# Patient Record
Sex: Male | Born: 1990 | Race: White | Hispanic: No | Marital: Married | Smoking: Former smoker
Health system: Southern US, Community
[De-identification: ages and names within clinical notes are randomized; demographics above are authoritative.]

## PROBLEM LIST (undated history)

## (undated) DIAGNOSIS — F419 Anxiety disorder, unspecified: Secondary | ICD-10-CM

## (undated) HISTORY — PX: APPENDECTOMY: SHX54

## (undated) HISTORY — DX: Anxiety disorder, unspecified: F41.9

---

## 2018-06-06 ENCOUNTER — Emergency Department (HOSPITAL_COMMUNITY): Payer: 59

## 2018-06-06 ENCOUNTER — Encounter (HOSPITAL_COMMUNITY): Payer: Self-pay | Admitting: Emergency Medicine

## 2018-06-06 ENCOUNTER — Emergency Department (HOSPITAL_COMMUNITY)
Admission: EM | Admit: 2018-06-06 | Discharge: 2018-06-06 | Disposition: A | Discharge status: Home / Self Care | Payer: 59 | Attending: Emergency Medicine | Admitting: Emergency Medicine

## 2018-06-06 DIAGNOSIS — R0602 Shortness of breath: Secondary | ICD-10-CM | POA: Diagnosis not present

## 2018-06-06 DIAGNOSIS — Z87891 Personal history of nicotine dependence: Secondary | ICD-10-CM | POA: Diagnosis not present

## 2018-06-06 LAB — BASIC METABOLIC PANEL
ANION GAP: 10 (ref 5–15)
BUN: 17 mg/dL (ref 6–20)
CALCIUM: 9.2 mg/dL (ref 8.9–10.3)
CO2: 27 mmol/L (ref 22–32)
Chloride: 104 mmol/L (ref 98–111)
Creatinine, Ser: 0.82 mg/dL (ref 0.61–1.24)
GLUCOSE: 116 mg/dL — AB (ref 70–99)
POTASSIUM: 3.9 mmol/L (ref 3.5–5.1)
SODIUM: 141 mmol/L (ref 135–145)

## 2018-06-06 LAB — CBC
HEMATOCRIT: 44.2 % (ref 39.0–52.0)
HEMOGLOBIN: 16.2 g/dL (ref 13.0–17.0)
MCH: 31.8 pg (ref 26.0–34.0)
MCHC: 36.7 g/dL — ABNORMAL HIGH (ref 30.0–36.0)
MCV: 86.8 fL (ref 78.0–100.0)
Platelets: 168 10*3/uL (ref 150–400)
RBC: 5.09 MIL/uL (ref 4.22–5.81)
RDW: 12.1 % (ref 11.5–15.5)
WBC: 4 10*3/uL (ref 4.0–10.5)

## 2018-06-06 LAB — I-STAT TROPONIN, ED: TROPONIN I, POC: 0.02 ng/mL (ref 0.00–0.08)

## 2018-06-06 LAB — D-DIMER, QUANTITATIVE (NOT AT ARMC)

## 2018-06-06 MED ORDER — ALBUTEROL SULFATE (2.5 MG/3ML) 0.083% IN NEBU
5.0000 mg | INHALATION_SOLUTION | Freq: Once | RESPIRATORY_TRACT | Status: AC
  Administered 2018-06-06: 5 mg via RESPIRATORY_TRACT
  Filled 2018-06-06: qty 6

## 2018-06-06 NOTE — ED Triage Notes (Signed)
Pt c/o central chest pain and SOB since Monday intermittently. Reports stopped smoking cigarettes and started smoking vape/jewel.

## 2018-06-06 NOTE — ED Provider Notes (Signed)
[facility]-EMERGENCY DEPT Provider Note   CSN: [id number] Arrival date & time: 06/06/18  [id number]     History   Chief Complaint Chief Complaint  Patient presents with  . Chest Pain  . Shortness of Breath    HPI George Villarreal is a 27 y.o. male history of appendectomy who presenting with shortness of breath.  States that for the last 3 to 4 days, he has some subjective shortness of breath.  He states that shortness of breath happens when he is at rest.  He recently read the news about vaping and was concerned.  Patient used to smoke cigarettes but started vaping and last time he used it was yesterday.  Patient denies any fevers or chills or cough.  He did travel to [place] a week ago and had a long car ride.  Denies any leg swelling or history of blood clots.  Denies any previous cardiac history.  The history is provided by the patient.    History reviewed. No pertinent past medical history.  There are no active problems to display for this patient.   Past Surgical History:  Procedure Laterality Date  . APPENDECTOMY          Home Medications    Prior to Admission medications   Not on File    Family History No family history on file.  Social History Social History   Tobacco Use  . Smoking status: Former Games developermoker  . Smokeless tobacco: Never Used  Substance Use Topics  . Alcohol use: Not Currently  . Drug use: Not on file     Allergies   Patient has no known allergies.   Review of Systems Review of Systems  Respiratory: Positive for shortness of breath.   Cardiovascular: Positive for chest pain.  All other systems reviewed and are negative.    Physical Exam Updated Vital Signs BP (!) 142/91 (BP Location: Right Arm)   Pulse 67   Temp 98.1 F (36.7 C) (Oral)   Resp 18   Ht 6' (1.829 m)   Wt 103 kg   SpO2 98%   BMI 30.79 kg/m   Physical Exam  Constitutional: He is oriented to person, place, and time.  Slightly anxious     HENT:  Head: Normocephalic.  Eyes: Pupils are equal, round, and reactive to light. EOM are normal.  Neck: Normal range of motion. Neck supple.  Cardiovascular: Normal rate, regular rhythm and normal pulses.  Pulmonary/Chest:  Diminished throughout. No obvious wheezing or crackles   Abdominal: Soft. Bowel sounds are normal.  Musculoskeletal: Normal range of motion.       Right lower leg: Normal.       Left lower leg: Normal.  Neurological: He is alert and oriented to person, place, and time.  Skin: Skin is warm. Capillary refill takes less than 2 seconds.  Psychiatric: He has a normal mood and affect. His behavior is normal.  Nursing note and vitals reviewed.    ED Treatments / Results  Labs (all labs ordered are listed, but only abnormal results are displayed) Labs Reviewed  CBC - Abnormal; Notable for the following components:      Result Value   MCHC 36.7 (*)    All other components within normal limits  BASIC METABOLIC PANEL  D-DIMER, QUANTITATIVE (NOT AT [facility])  I-STAT TROPONIN, ED    EKG EKG Interpretation  Date/Time:  Thursday June 06 2018 08:50:10 EDT Ventricular Rate:  75 PR Interval:    QRS Duration: 115  QT Interval:  382 QTC Calculation: 427 R Axis:   -68 Text Interpretation:  Sinus rhythm LAD, consider left anterior fascicular block No previous ECGs available Confirmed by Richardean Canal [phone]) on 06/06/2018 9:20:36 AM   Radiology No results found.  Procedures Procedures (including critical care time)  Medications Ordered in ED Medications  albuterol (PROVENTIL) (2.5 MG/3ML) 0.083% nebulizer solution 5 mg (has no administration in time range)     Initial Impression / Assessment and Plan / ED Course  I have reviewed the triage vital signs and the nursing notes.  Pertinent labs & imaging results that were available during my care of the patient were reviewed by me and considered in my medical decision making (see chart for details).    George Villarreal is a 27 y.o. male here with SOB. I think likely mild bronchitis. He does vape so consider pneumothorax as well so will get CXR. Had recent travel to [place] so will get d-dimer. Will give nebs and reassess.   11:09 AM CXR clear. No pneumothorax or pneumomediastinum. D dimer neg. Labs unremarkable. Given nebs but felt the same. I think likely side effect of vaping. Told him to stop vaping and follow up with PCP outpatient and return if symptoms got worse    Final Clinical Impressions(s) / ED Diagnoses   Final diagnoses:  None    ED Discharge Orders    None       Charlynne Pander, MD 06/06/18 1109

## 2018-06-06 NOTE — Discharge Instructions (Signed)
Take tylenol or motrin if you have pain   Stop smoking and vaping   Follow up with your doctor   Return to ER if you have worse shortness of breath, trouble breathing, fever.

## 2019-04-25 ENCOUNTER — Telehealth: Payer: Self-pay

## 2019-04-25 NOTE — Telephone Encounter (Signed)
NOTES ON FILE FROM [place] [phone] SENT REFERRAL TO SCHEDULING-

## 2019-06-23 ENCOUNTER — Telehealth: Payer: Self-pay | Admitting: Internal Medicine

## 2019-06-23 NOTE — Telephone Encounter (Signed)
New message:     Patient needs a appt with Dr. Rayann Heman there is a referral in [place] I am not sure if we doing NP please let me know when he is urgent.

## 2019-06-24 NOTE — Progress Notes (Signed)
Virtual Visit via Video Note   This visit type was conducted due to national recommendations for restrictions regarding the COVID-19 Pandemic (e.g. social distancing) in an effort to limit this patient's exposure and mitigate transmission in our community.  Due to his co-morbid illnesses, this patient is at least at moderate risk for complications without adequate follow up.  This format is felt to be most appropriate for this patient at this time.  All issues noted in this document were discussed and addressed.  A limited physical exam was performed with this format.  Please refer to the patient's chart for his consent to telehealth for [facility].   Evaluation Performed:  Cardiology Consult  This visit type was conducted due to national recommendations for restrictions regarding the COVID-19 Pandemic (e.g. social distancing).  This format is felt to be most appropriate for this patient at this time.  All issues noted in this document were discussed and addressed.  No physical exam was performed (except for noted visual exam findings with Video Visits).  Please refer to the patient's chart (MyChart message for video visits and phone note for telephone visits) for the patient's consent to telehealth for [facility].  Date:  06/25/2019   ID:  George Vill[facility]real, DOB [birth year], MRN [id number]  Patient Location:  Home  Provider location:   [place]  PCP: Joyce Gross, DO with [facility]  Cardiologist:  NEW Electrophysiologist:  None   Chief Complaint:  Chest pain and SOB  History of Present Illness:    George Villarreal is a 28 y.o. male who presents via audio/video conferencing for a telehealth visit today for evaluation of chest pain, SOB and IRBB by Joyce Gross, DO.  This is a 28yo male with no prior cardiac hx but a hx of anxiety.  He tells me that he started having CP about a year ago that was a constant pressure that was hard to get a breath with.  It was worse  when he would lay down to go to sleep and would feel like he could not breath.  It subsided over the winter and then in June came back and lasted a month and then went away.  There are no associated sx of N/V or diaphoresis.  There is no radiation of the discomfort.  It is localized mid sternal and will be constant never going away but gets worse when he lays down on his back or chest. He has no SOB.  He denies any LE edema, dizziness, palpitations or syncope.  He used to smoke but quit 6 months ago.  He has a maternal grandmother who had CAD in her late 49's and died during a PCI.  His mom has carotid disease.   The patient does not have symptoms concerning for COVID-19 infection (fever, chills, cough, or new shortness of breath).    Prior CV studies:   The following studies were reviewed today:  none  Past Medical History:  Diagnosis Date   Anxiety    Past Surgical History:  Procedure Laterality Date   APPENDECTOMY       No outpatient medications have been marked as taking for the 06/25/19 encounter (Telemedicine) with Sueanne Margarita, MD.     Allergies:   Patient has no known allergies.   Social History   Tobacco Use   Smoking status: Former Smoker   Smokeless tobacco: Never Used  Substance Use Topics   Alcohol use: Not Currently   Drug use: Not on file  Family Hx: The patient's family history is not on file.  ROS:   Please see the history of present illness.     All other systems reviewed and are negative.   Labs/Other Tests and Data Reviewed:    Recent Labs: No results found for requested labs within last 8760 hours.   Recent Lipid Panel No results found for: CHOL, TRIG, HDL, CHOLHDL, LDLCALC, LDLDIRECT  Wt Readings from Last 3 Encounters:  06/06/18 227 lb (103 kg)     Objective:    Vital Signs:  There were no vitals taken for this visit.   CONSTITUTIONAL:  Well nourished, well developed male in no acute distress.  EYES: anicteric MOUTH: oral  mucosa is pink RESPIRATORY: Normal respiratory effort, symmetric expansion CARDIOVASCULAR: No peripheral edema SKIN: No rash, lesions or ulcers MUSCULOSKELETAL: no digital cyanosis NEURO: Cranial Nerves II-XII grossly intact, moves all extremities PSYCH: Intact judgement and insight.  A&O x 3, Mood/affect appropriate   ASSESSMENT & PLAN:    1.  Chest pain -his pain is very atypical and is constant for a few days at a time with no associated sx -the pain is a pressure and he dose have vascular disease on his mom's side of the family and he has a hx of tobacco use -recommend Cardiac MRI to assess for anomalous coronary artery as well as assess for structural heart disease -will get an ETT   COVID-19 Education: The signs and symptoms of COVID-19 were discussed with the patient and Villarreal to seek care for testing (follow up with PCP or arrange E-visit).  The importance of social distancing was discussed today.  Patient Risk:   After full review of this patient's clinical status, I feel that they are at least moderate risk at this time.  Time:   Today, I have spent 20 minutes directly with the patient on telemedicine discussing medical problems including CP and SOB.  We also reviewed the symptoms of COVID 19 and the ways to protect against contracting the virus with telehealth technology.  I spent an additional 5 minutes reviewing patient's chart including office notes from PCP.  Medication Adjustments/Labs and Tests Ordered: Current medicines are reviewed at length with the patient today.  Concerns regarding medicines are outlined above.  Tests Ordered: No orders of the defined types were placed in this encounter.  Medication Changes: No orders of the defined types were placed in this encounter.   Disposition:  Follow up prn  Signed, Armanda Magic, MD  06/25/2019 10:25 AM    [facility]

## 2019-06-25 ENCOUNTER — Telehealth (INDEPENDENT_AMBULATORY_CARE_PROVIDER_SITE_OTHER): Payer: 59 | Admitting: Cardiology

## 2019-06-25 ENCOUNTER — Encounter: Payer: Self-pay | Admitting: Cardiology

## 2019-06-25 ENCOUNTER — Other Ambulatory Visit: Payer: Self-pay

## 2019-06-25 ENCOUNTER — Telehealth: Payer: Self-pay | Admitting: *Deleted

## 2019-06-25 ENCOUNTER — Encounter: Payer: Self-pay | Admitting: *Deleted

## 2019-06-25 DIAGNOSIS — R079 Chest pain, unspecified: Secondary | ICD-10-CM | POA: Diagnosis not present

## 2019-06-25 DIAGNOSIS — R0789 Other chest pain: Secondary | ICD-10-CM | POA: Diagnosis not present

## 2019-06-25 NOTE — Telephone Encounter (Signed)
YOUR CARDIOLOGY TEAM HAS ARRANGED FOR AN E-VISIT FOR YOUR APPOINTMENT - PLEASE REVIEW IMPORTANT INFORMATION BELOW SEVERAL DAYS PRIOR TO YOUR APPOINTMENT  Due to the recent COVID-19 pandemic, we are transitioning in-person office visits to tele-medicine visits in an effort to decrease unnecessary exposure to our patients, their families, and staff. These visits are billed to your insurance just like a normal visit is. We also encourage you to sign up for MyChart if you have not already done so. You will need a smartphone if possible. For patients that do not have this, we can still complete the visit using a regular telephone but do prefer a smartphone to enable video when possible. You may have a family member that lives with you that can help. If possible, we also ask that you have a blood pressure cuff and scale at home to measure your blood pressure, heart rate and weight prior to your scheduled appointment. Patients with clinical needs that need an in-person evaluation and testing will still be able to come to the office if absolutely necessary. If you have any questions, feel free to call our office.     YOUR PROVIDER WILL BE USING THE FOLLOWING PLATFORM TO COMPLETE YOUR VISIT: VIDEO CALL ON MOBILE NUMBER  . IF USING DOXIMITY or [email or web address] - The staff will give you instructions on receiving your link to join the meeting the day of your visit.       THE DAY OF YOUR APPOINTMENT  Approximately 15 minutes prior to your scheduled appointment, you will receive a telephone call from one of [facility] team - your caller ID may say "Unknown caller."  Our staff will confirm medications, vital signs for the day and any symptoms you may be experiencing. Please have this information available prior to the time of visit start. It may also be helpful for you to have a pad of paper and pen handy for any instructions given during your visit. They will also walk you through joining the smartphone meeting if this is  a video visit.    CONSENT FOR TELE-HEALTH VISIT - PLEASE REVIEW  I hereby voluntarily request, consent and authorize [facility] and its employed or contracted physicians, physician assistants, nurse practitioners or other licensed health care professionals (the Practitioner), to provide me with telemedicine health care services (the "Services") as deemed necessary by the treating Practitioner. I acknowledge and consent to receive the Services by the Practitioner via telemedicine. I understand that the telemedicine visit will involve communicating with the Practitioner through live audiovisual communication technology and the disclosure of certain medical information by electronic transmission. I acknowledge that I have been given the opportunity to request an in-person assessment or other available alternative prior to the telemedicine visit and am voluntarily participating in the telemedicine visit.  I understand that I have the right to withhold or withdraw my consent to the use of telemedicine in the course of my care at any time, without affecting my right to future care or treatment, and that the Practitioner or I may terminate the telemedicine visit at any time. I understand that I have the right to inspect all information obtained and/or recorded in the course of the telemedicine visit and may receive copies of available information for a reasonable fee.  I understand that some of the potential risks of receiving the Services via telemedicine include:  Marland Kitchen Delay or interruption in medical evaluation due to technological equipment failure or disruption; . Information transmitted may not be sufficient (e.g. poor resolution  of images) to allow for appropriate medical decision making by the Practitioner; and/or  . In rare instances, security protocols could fail, causing a breach of personal health information.  Furthermore, I acknowledge that it is my responsibility to provide information about my  medical history, conditions and care that is complete and accurate to the best of my ability. I acknowledge that Practitioner's advice, recommendations, and/or decision may be based on factors not within their control, such as incomplete or inaccurate data provided by me or distortions of diagnostic images or specimens that may result from electronic transmissions. I understand that the practice of medicine is not an exact science and that Practitioner makes no warranties or guarantees regarding treatment outcomes. I acknowledge that I will receive a copy of this consent concurrently upon execution via email to the email address I last provided but may also request a printed copy by calling the office of [facility].    I understand that my insurance will be billed for this visit.   I have read or had this consent read to me. . I understand the contents of this consent, which adequately explains the benefits and risks of the Services being provided via telemedicine.  . I have been provided ample opportunity to ask questions regarding this consent and the Services and have had my questions answered to my satisfaction. . I give my informed consent for the services to be provided through the use of telemedicine in my medical care  By participating in this telemedicine visit I agree to the above.

## 2019-06-25 NOTE — Patient Instructions (Addendum)
Your physician recommends that you continue on your current medications as directed. Please refer to the Current Medication list given to you today.   Your physician has requested that you have an exercise tolerance test. For further information please visit [email or web address].[email or web address]. Please also follow instruction sheet, as given.   Your physician has requested that you have a cardiac MRI. Cardiac MRI uses a computer to create images of your heart as its beating, producing both still and moving pictures of your heart and major blood vessels. For further information please visit [email or web address] Please follow the instruction sheet given to you today for more information. AND MRA  Your physician recommends that you schedule a follow-up appointment in:  AS NEEDED

## 2019-07-03 ENCOUNTER — Encounter: Payer: Self-pay | Admitting: Cardiology

## 2019-07-07 ENCOUNTER — Telehealth: Payer: Self-pay | Admitting: Cardiology

## 2019-07-07 NOTE — Telephone Encounter (Signed)
Faxing to expedited appeals

## 2019-07-07 NOTE — Telephone Encounter (Signed)
-----   Message from Sueanne Margarita, MD sent at 07/06/2019  7:55 PM EDT ----- Regarding: RE: cardiac mri Cardiac MRI is to rule out anomalous coronary artery which 2D echo cannot assess - this should be covered by insurance if anomalous coronary artery dx used  Fransico Him ----- Message ----- From: Benancio Deeds Sent: 07/04/2019   8:05 AM EDT To: Sueanne Margarita, MD Subject: cardiac mri                                    [employer] has approved MRA chest, however is denying cardiac mri. Their policy states they have to have a recent echo prior to considering approval of a cardiac mri.  We could set up a peer review, schedule patient for an echo, or I could certainly try and appeal this for you.  Please advise on what you would prefer.  Thank you!    Caryl Pina

## 2019-07-09 ENCOUNTER — Telehealth: Payer: Self-pay | Admitting: *Deleted

## 2019-07-09 NOTE — Telephone Encounter (Signed)
Spoke with patient regarding appointment for Cardiac MRI and MRA Chest scheduled for 07/21/19 at 2:00 pm at [facility].  Patient states he is aware of this appointment.

## 2019-07-21 ENCOUNTER — Ambulatory Visit (HOSPITAL_COMMUNITY): Payer: 59

## 2019-07-29 ENCOUNTER — Other Ambulatory Visit: Payer: Self-pay | Admitting: *Deleted

## 2019-07-29 DIAGNOSIS — R079 Chest pain, unspecified: Secondary | ICD-10-CM

## 2019-08-01 ENCOUNTER — Telehealth (HOSPITAL_COMMUNITY): Payer: Self-pay | Admitting: Emergency Medicine

## 2019-08-01 NOTE — Telephone Encounter (Signed)
Reaching out to patient to offer assistance regarding upcoming cardiac imaging study; pt verbalizes understanding of appt date/time, parking situation and where to check in, and verified current allergies; name and call back number provided for further questions should they arise Tyyonna Soucy RN Navigator Cardiac Imaging [facility] [phone] office [phone] cell 

## 2019-08-04 ENCOUNTER — Ambulatory Visit (HOSPITAL_COMMUNITY): Payer: 59

## 2019-08-26 ENCOUNTER — Telehealth (HOSPITAL_COMMUNITY): Payer: Self-pay | Admitting: Emergency Medicine

## 2019-08-26 NOTE — Telephone Encounter (Signed)
Reaching out to patient to offer assistance regarding upcoming cardiac imaging study; pt verbalizes understanding of appt date/time, parking situation and where to check in, pre-test NPO status and medications ordered, and verified current allergies; name and call back number provided for further questions should they arise Imran Nuon RN Navigator Cardiac Imaging [facility] [phone] office [phone] cell 

## 2019-08-27 ENCOUNTER — Ambulatory Visit (HOSPITAL_COMMUNITY): Admission: RE | Admit: 2019-08-27 | Payer: 59 | Source: Ambulatory Visit

## 2019-08-28 ENCOUNTER — Telehealth (HOSPITAL_COMMUNITY): Payer: Self-pay

## 2019-08-28 NOTE — Telephone Encounter (Signed)
Encounter complete. 

## 2019-08-29 ENCOUNTER — Other Ambulatory Visit (HOSPITAL_COMMUNITY): Payer: 59

## 2019-09-02 ENCOUNTER — Inpatient Hospital Stay (HOSPITAL_COMMUNITY): Admission: RE | Admit: 2019-09-02 | Payer: 59 | Source: Ambulatory Visit

## 2019-09-06 ENCOUNTER — Other Ambulatory Visit (HOSPITAL_COMMUNITY)
Admission: RE | Admit: 2019-09-06 | Discharge: 2019-09-06 | Disposition: A | Discharge status: Home / Self Care | Payer: 59 | Source: Ambulatory Visit | Attending: Cardiology | Admitting: Cardiology

## 2019-09-06 DIAGNOSIS — Z01812 Encounter for preprocedural laboratory examination: Secondary | ICD-10-CM | POA: Diagnosis present

## 2019-09-06 DIAGNOSIS — Z20828 Contact with and (suspected) exposure to other viral communicable diseases: Secondary | ICD-10-CM | POA: Diagnosis not present

## 2019-09-07 LAB — NOVEL CORONAVIRUS, NAA (HOSP ORDER, SEND-OUT TO REF LAB; TAT 18-24 HRS): SARS-CoV-2, NAA: NOT DETECTED

## 2019-09-10 ENCOUNTER — Other Ambulatory Visit: Payer: Self-pay

## 2019-09-10 ENCOUNTER — Ambulatory Visit (HOSPITAL_COMMUNITY)
Admission: RE | Admit: 2019-09-10 | Discharge: 2019-09-10 | Disposition: A | Discharge status: Home / Self Care | Payer: 59 | Source: Ambulatory Visit | Attending: Cardiology | Admitting: Cardiology

## 2019-09-10 DIAGNOSIS — R079 Chest pain, unspecified: Secondary | ICD-10-CM | POA: Diagnosis not present

## 2019-09-10 LAB — EXERCISE TOLERANCE TEST
Estimated workload: 12.6 METS
Exercise duration (min): 10 min
Exercise duration (sec): 34 s
MPHR: 190 {beats}/min
Peak HR: 173 {beats}/min
Percent HR: 91 %
RPE: 16
Rest HR: 75 {beats}/min

## 2019-09-11 ENCOUNTER — Telehealth: Payer: Self-pay | Admitting: *Deleted

## 2019-09-11 ENCOUNTER — Encounter: Payer: Self-pay | Admitting: *Deleted

## 2019-09-11 NOTE — Telephone Encounter (Signed)
Spoke with patient regarding cardiac mri appointment scheduled 09/15/19 at 12:00pm at [facility]---arrival time 11:15 am 1st floor admissions office. Will mail information to patient  And it is also available in My Chart

## 2019-09-12 ENCOUNTER — Telehealth (HOSPITAL_COMMUNITY): Payer: Self-pay | Admitting: Emergency Medicine

## 2019-09-12 ENCOUNTER — Telehealth: Payer: Self-pay | Admitting: Radiology

## 2019-09-12 ENCOUNTER — Encounter (HOSPITAL_COMMUNITY): Payer: Self-pay

## 2019-09-12 DIAGNOSIS — I493 Ventricular premature depolarization: Secondary | ICD-10-CM

## 2019-09-12 NOTE — Telephone Encounter (Signed)
Left message on voicemail with name and callback number Orval Dortch RN Navigator Cardiac Imaging [facility] [phone] Office [phone] Cell  

## 2019-09-12 NOTE — Telephone Encounter (Signed)
Enrolled patient for a 4 day Zio monitor to be mailed to patients home.  

## 2019-09-15 ENCOUNTER — Ambulatory Visit (HOSPITAL_COMMUNITY)
Admission: RE | Admit: 2019-09-15 | Discharge: 2019-09-15 | Disposition: A | Discharge status: Home / Self Care | Payer: 59 | Source: Ambulatory Visit | Attending: Cardiology | Admitting: Cardiology

## 2019-09-15 ENCOUNTER — Other Ambulatory Visit: Payer: Self-pay

## 2019-09-15 DIAGNOSIS — R079 Chest pain, unspecified: Secondary | ICD-10-CM | POA: Insufficient documentation

## 2019-09-15 MED ORDER — GADOBUTROL 1 MMOL/ML IV SOLN
14.0000 mL | Freq: Once | INTRAVENOUS | Status: AC | PRN
  Administered 2019-09-15: 14:00:00 14 mL via INTRAVENOUS

## 2019-09-27 ENCOUNTER — Other Ambulatory Visit (INDEPENDENT_AMBULATORY_CARE_PROVIDER_SITE_OTHER): Payer: 59

## 2019-09-27 DIAGNOSIS — I493 Ventricular premature depolarization: Secondary | ICD-10-CM

## 2019-10-06 ENCOUNTER — Other Ambulatory Visit (HOSPITAL_COMMUNITY): Payer: 59

## 2020-05-30 IMAGING — MR MR CARD MORPHOLOGY WO/W CM
46 of 48 series · 46 of 48 positions shown · IV contrast (gadavist)
Comparison: none

CLINICAL DATA: 28M with frequent PVCs on ETT. Evaluate for coronary
artery anomalies

EXAM:
CARDIAC MRI
TECHNIQUE: The patient was scanned on a 1.5 Tesla Siemens magnet. A dedicated
cardiac coil was used. Functional imaging was done using [employer]
sequences. [DATE], and 4 chamber views were done to assess for RWMA's.
Modified [place] rule using a short axis stack was used to
calculate an ejection fraction on a dedicated work station using
[employer] software. The patient received 14 cc of Gadavist. After 10
minutes inversion recovery sequences were used to assess for
infiltration and scar tissue.
CONTRAST:  14 cc  of Gadavist

[Series 6: bSSFP · oblique · 8.0mm · 1.61mm/px · 1 of 25 slices shown (1 of 17)]
[im 1/25]
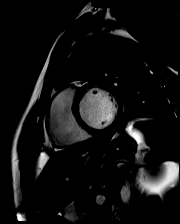

[Series 6: bSSFP · oblique · 8.0mm · 1.61mm/px · 1 of 25 slices shown (2 of 17)]
[im 1/25]
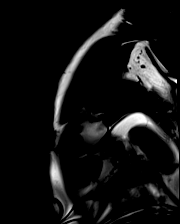

[Series 6: bSSFP · oblique · 8.0mm · 1.61mm/px · 1 of 25 slices shown (3 of 17)]
[im 1/25]
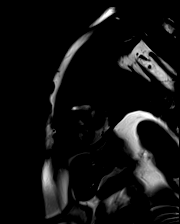

[Series 6: bSSFP · oblique · 8.0mm · 1.61mm/px · 1 of 25 slices shown (4 of 17)]
[im 1/25]
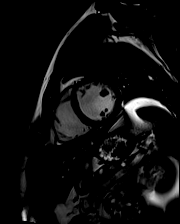

[Series 6: bSSFP · oblique · 8.0mm · 1.61mm/px · 1 of 25 slices shown (5 of 17)]
[im 1/25]
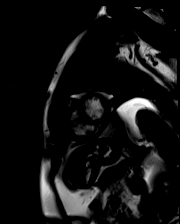

[Series 6: bSSFP · oblique · 8.0mm · 1.61mm/px · 1 of 25 slices shown (6 of 17)]
[im 1/25]
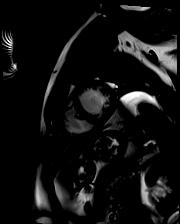

[Series 6: bSSFP · oblique · 8.0mm · 1.61mm/px · 1 of 25 slices shown (7 of 17)]
[im 1/25]
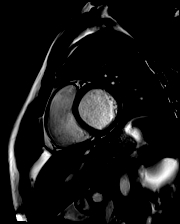

[Series 6: bSSFP · oblique · 8.0mm · 1.61mm/px · 1 of 25 slices shown (8 of 17)]
[im 1/25]
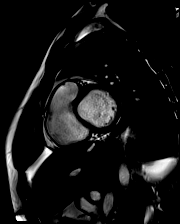

[Series 6: bSSFP · oblique · 8.0mm · 1.61mm/px · 1 of 25 slices shown (9 of 17)]
[im 1/25]
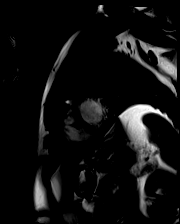

[Series 6: bSSFP · oblique · 8.0mm · 1.61mm/px · 1 of 25 slices shown (10 of 17)]
[im 1/25]
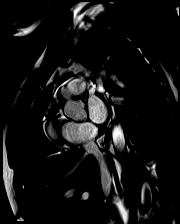

[Series 6: bSSFP · oblique · 8.0mm · 1.61mm/px · 1 of 25 slices shown (11 of 17)]
[im 1/25]
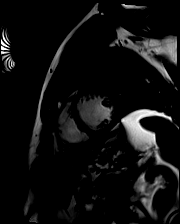

[Series 6: bSSFP · oblique · 8.0mm · 1.61mm/px · 1 of 25 slices shown (12 of 17)]
[im 1/25]
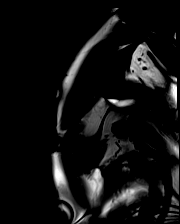

[Series 6: bSSFP · oblique · 8.0mm · 1.61mm/px · 1 of 25 slices shown (13 of 17)]
[im 1/25]
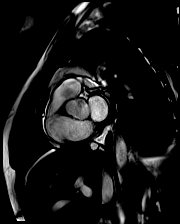

[Series 6: bSSFP · oblique · 8.0mm · 1.61mm/px · 1 of 25 slices shown (14 of 17)]
[im 1/25]
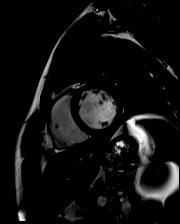

[Series 6: bSSFP · oblique · 8.0mm · 1.61mm/px · 1 of 25 slices shown (15 of 17)]
[im 1/25]
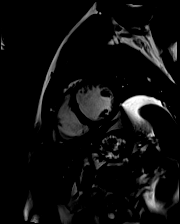

[Series 6: bSSFP · oblique · 8.0mm · 1.61mm/px · 1 of 25 slices shown (16 of 17)]
[im 1/25]
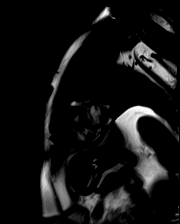

[Series 6: bSSFP · oblique · 8.0mm · 1.61mm/px · 1 of 25 slices shown (17 of 17)]
[im 1/25]
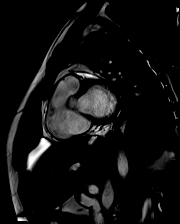

[Series 7: t2_stir_db_radial ((date)ch) · axial · 6.0mm · 1.73mm/px · 1 of 2 slices shown]
[im 1/2]
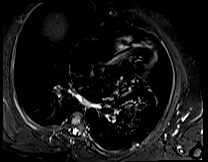

[Series 8: (id)_long_t1 · oblique · 8.0mm · 1.41mm/px · 1 of 24 slices shown]
[im 1/24]
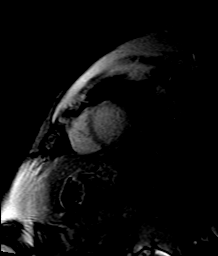

[Series 9: (id)_long_t1_moco · oblique · 8.0mm · 1.41mm/px · 1 of 24 slices shown]
[im 1/24]
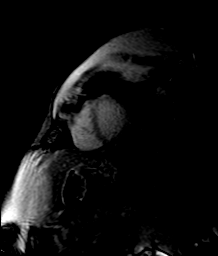

[Series 10: (id)_long_t1_moco_t1 · oblique · 8.0mm · 1.41mm/px · 1 of 6 slices shown]
[im 1/6]
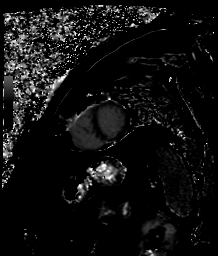

[Series 12: (id)_trufi · oblique · 8.0mm · 1.88mm/px · 1 of 9 slices shown]
[im 1/9]
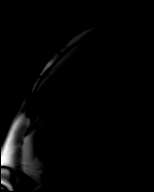

[Series 13: (id)_trufi_moco · oblique · 8.0mm · 1.88mm/px · 1 of 9 slices shown]
[im 1/9]
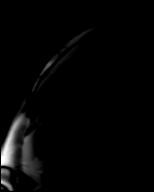

[Series 14: (id)_trufi_moco_t2 · oblique · 8.0mm · 1.88mm/px · 1 of 3 slices shown]
[im 1/3]
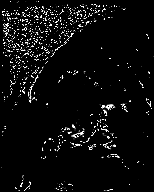

[Series 17: dir oblique coronary · coronal · 4.0mm · 1.25mm/px · 1 of 4 slices shown (1 of 2)]
[im 1/4]
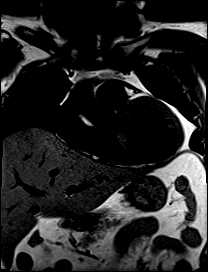

[Series 18: dir oblique coronary · oblique · 4.0mm · 1.25mm/px · 1 of 8 slices shown (2 of 2)]
[im 1/8]
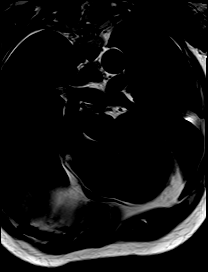

[Series 20: dir oblique axial · oblique · 4.0mm · 1.03mm/px · 1 of 12 slices shown]
[im 1/12]
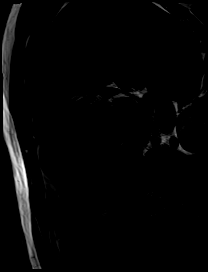

[Series 21: ax haste · sagittal · 3.0mm · 1.19mm/px · 1 of 15 slices shown]
[im 1/15]
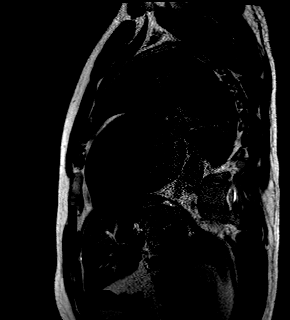

[Series 22: cine_trufi_cs_rt_short axis · sagittal · 4.0mm · 1.73mm/px · 1 of 17 slices shown (1 of 18)]
[im 1/17]
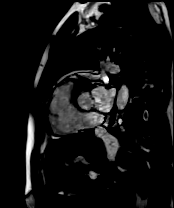

[Series 22: cine_trufi_cs_rt_short axis · sagittal · 4.0mm · 1.73mm/px · 1 of 17 slices shown (2 of 18)]
[im 1/17]
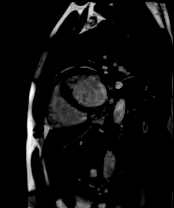

[Series 22: cine_trufi_cs_rt_short axis · sagittal · 4.0mm · 1.73mm/px · 1 of 17 slices shown (3 of 18)]
[im 1/17]
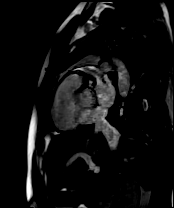

[Series 22: cine_trufi_cs_rt_short axis · sagittal · 4.0mm · 1.73mm/px · 1 of 17 slices shown (4 of 18)]
[im 1/17]
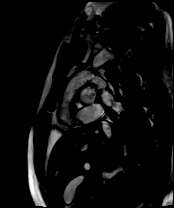

[Series 22: cine_trufi_cs_rt_short axis · sagittal · 4.0mm · 1.73mm/px · 1 of 17 slices shown (5 of 18)]
[im 1/17]
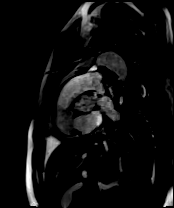

[Series 22: cine_trufi_cs_rt_short axis · sagittal · 4.0mm · 1.73mm/px · 1 of 17 slices shown (6 of 18)]
[im 1/17]
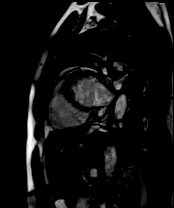

[Series 22: cine_trufi_cs_rt_short axis · sagittal · 4.0mm · 1.73mm/px · 1 of 17 slices shown (7 of 18)]
[im 1/17]
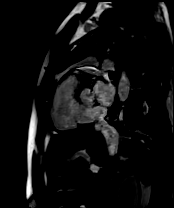

[Series 22: cine_trufi_cs_rt_short axis · sagittal · 4.0mm · 1.73mm/px · 1 of 17 slices shown (8 of 18)]
[im 1/17]
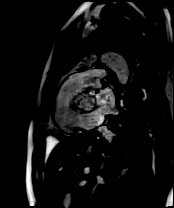

[Series 22: cine_trufi_cs_rt_short axis · sagittal · 4.0mm · 1.73mm/px · 1 of 17 slices shown (9 of 18)]
[im 1/17]
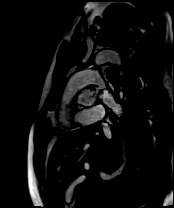

[Series 22: cine_trufi_cs_rt_short axis · sagittal · 4.0mm · 1.73mm/px · 1 of 17 slices shown (10 of 18)]
[im 1/17]
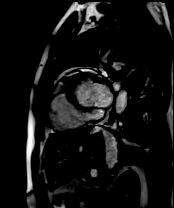

[Series 22: cine_trufi_cs_rt_short axis · sagittal · 4.0mm · 1.73mm/px · 1 of 17 slices shown (11 of 18)]
[im 1/17]
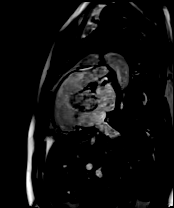

[Series 22: cine_trufi_cs_rt_short axis · sagittal · 4.0mm · 1.73mm/px · 1 of 17 slices shown (12 of 18)]
[im 1/17]
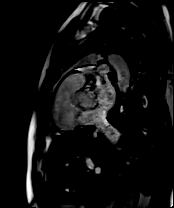

[Series 22: cine_trufi_cs_rt_short axis · sagittal · 4.0mm · 1.73mm/px · 1 of 17 slices shown (13 of 18)]
[im 1/17]
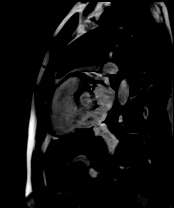

[Series 22: cine_trufi_cs_rt_short axis · sagittal · 4.0mm · 1.73mm/px · 1 of 17 slices shown (14 of 18)]
[im 1/17]
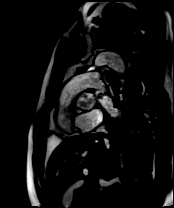

[Series 23: cine_trufi_cs_rt_short axis · sagittal · 4.0mm · 1.54mm/px · 1 of 17 slices shown (15 of 18)]
[im 1/17]
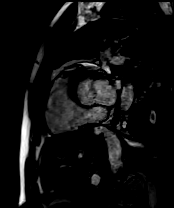

[Series 23: cine_trufi_cs_rt_short axis · sagittal · 4.0mm · 1.54mm/px · 1 of 17 slices shown (16 of 18)]
[im 1/17]
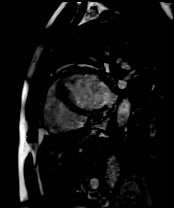

[Series 23: cine_trufi_cs_rt_short axis · sagittal · 4.0mm · 1.54mm/px · 1 of 17 slices shown (17 of 18)]
[im 1/17]
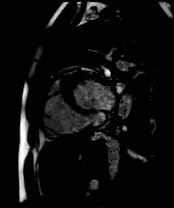

[Series 23: cine_trufi_cs_rt_short axis · sagittal · 4.0mm · 1.54mm/px · 1 of 17 slices shown (18 of 18)]
[im 1/17]
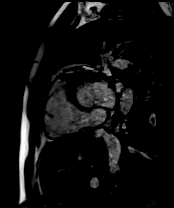

[46 of 48 positions shown; findings below may reference images not displayed]

FINDINGS: Left ventricle:

- Mild dilatation

- Normal wall thickness

- Normal systolic function

- Normal ECV

- No LGE

LV EF: 60% (Normal 56-78%)

Absolute volumes:

LV EDV: 241mL (Normal 77-195 mL)

LV ESV: 97mL (Normal 19-72 mL)

LV SV: 143mL (Normal 51-133 mL)

CO: 7.7L/min (Normal 2.8-8.8 L/min)

Indexed volumes:

LV EDV: 105mL/sq-m (Normal 47-92 mL/sq-m)

LV ESV: 43mL/sq-m (Normal 13-30 mL/sq-m)

LV SV: 63mL/sq-m (Normal 32-62 mL/sq-m)

CI: 3.4L/min/sq-m (Normal 1.7-4.2 L/min/sq-m)

Right ventricle: Mild dilatation.  Normal systolic function

RV EF:  53% (Normal 47-74%)

Absolute volumes:

RV EDV: 261mL (Normal 88-227 mL)

RV ESV: 122mL (Normal 23-103 mL)

RV SV: 139mL (Normal 52-138 mL)

CO: 7.4L/min (Normal 2.8-8.8 L/min)

Indexed volumes:

RV EDV: 114mL/sq-m (Normal 55-105 mL/sq-m)

RV ESV: 53mL/sq-m (Normal 15-43 mL/sq-m)

RV SV: 61mL/sq-m (Normal 32-64 mL/sq-m)

CI: 3.2L/min/sq-m (Normal 1.7-4.2 L/min/sq-m)

Left atrium: Mild enlargement

Right atrium: Mild enlargement

Mitral valve: No regurgitation

Aortic valve: No regurgitation

Tricuspid valve: No regurgitation

Coronary arteries: Not well visualized but appears to be normal

Pericardium: Normal
IMPRESSION: 1. There appears to be normal coronary origins but course of
arteries is not well-visualized. If concern for anomalous
coronaries, coronary CTA is recommended rather than MRI due to
superior spatial resolution

2. Mild LV dilatation, normal wall thickness, and normal systolic
function (EF 60%)

3.  Mild RV dilatation with normal systolic function (EF 53%)

4.  No late gadolinium enhancement

## 2024-07-16 ENCOUNTER — Encounter (HOSPITAL_BASED_OUTPATIENT_CLINIC_OR_DEPARTMENT_OTHER): Payer: Self-pay | Admitting: *Deleted

## 2024-07-16 ENCOUNTER — Emergency Department (HOSPITAL_BASED_OUTPATIENT_CLINIC_OR_DEPARTMENT_OTHER)
Admission: EM | Admit: 2024-07-16 | Discharge: 2024-07-16 | Disposition: A | Discharge status: Home / Self Care | Attending: Emergency Medicine | Admitting: Emergency Medicine

## 2024-07-16 ENCOUNTER — Other Ambulatory Visit: Payer: Self-pay

## 2024-07-16 ENCOUNTER — Emergency Department (HOSPITAL_BASED_OUTPATIENT_CLINIC_OR_DEPARTMENT_OTHER)

## 2024-07-16 DIAGNOSIS — R0789 Other chest pain: Secondary | ICD-10-CM | POA: Insufficient documentation

## 2024-07-16 DIAGNOSIS — R072 Precordial pain: Secondary | ICD-10-CM | POA: Diagnosis not present

## 2024-07-16 DIAGNOSIS — R079 Chest pain, unspecified: Secondary | ICD-10-CM | POA: Diagnosis present

## 2024-07-16 DIAGNOSIS — R030 Elevated blood-pressure reading, without diagnosis of hypertension: Secondary | ICD-10-CM | POA: Diagnosis not present

## 2024-07-16 LAB — CBC
HCT: 43.2 % (ref 39.0–52.0)
Hemoglobin: 16.2 g/dL (ref 13.0–17.0)
MCH: 31.6 pg (ref 26.0–34.0)
MCHC: 37.5 g/dL — ABNORMAL HIGH (ref 30.0–36.0)
MCV: 84.2 fL (ref 80.0–100.0)
Platelets: 169 K/uL (ref 150–400)
RBC: 5.13 MIL/uL (ref 4.22–5.81)
RDW: 11.9 % (ref 11.5–15.5)
WBC: 6.4 K/uL (ref 4.0–10.5)
nRBC: 0 % (ref 0.0–0.2)

## 2024-07-16 LAB — BASIC METABOLIC PANEL WITH GFR
Anion gap: 16 — ABNORMAL HIGH (ref 5–15)
BUN: 13 mg/dL (ref 6–20)
CO2: 20 mmol/L — ABNORMAL LOW (ref 22–32)
Calcium: 9.6 mg/dL (ref 8.9–10.3)
Chloride: 104 mmol/L (ref 98–111)
Creatinine, Ser: 0.77 mg/dL (ref 0.61–1.24)
GFR, Estimated: >60 mL/min (ref >60)
Glucose, Bld: 92 mg/dL (ref 70–99)
Potassium: 3.6 mmol/L (ref 3.5–5.1)
Sodium: 139 mmol/L (ref 135–145)

## 2024-07-16 LAB — TROPONIN T, HIGH SENSITIVITY
Troponin T High Sensitivity: <15 ng/L (ref 0–19)
Troponin T High Sensitivity: <15 ng/L (ref 0–19)

## 2024-07-16 MED ORDER — ALUM & MAG HYDROXIDE-SIMETH 200-200-20 MG/5ML PO SUSP
30.0000 mL | Freq: Once | ORAL | Status: AC
  Administered 2024-07-16: 30 mL via ORAL
  Filled 2024-07-16: qty 30

## 2024-07-16 MED ORDER — FAMOTIDINE 20 MG PO TABS: 20.0000 mg | ORAL_TABLET | Freq: Once | ORAL | Status: DC

## 2024-07-16 MED ORDER — ACETAMINOPHEN 500 MG PO TABS
1000.0000 mg | ORAL_TABLET | Freq: Once | ORAL | Status: AC
  Administered 2024-07-16: 1000 mg via ORAL
  Filled 2024-07-16: qty 2

## 2024-07-16 NOTE — ED Notes (Signed)
 ED Provider at bedside.

## 2024-07-16 NOTE — ED Provider Notes (Signed)
 [facility] Provider Note   CSN: [id number] Arrival date & time: 07/16/24  1906     Patient presents with: Chest Pain   George Villarreal is a 33 y.o. male.   Pt with c/o mid chest pain in past four weeks. States prior to symptom onset he was laying on metal floor with ridge and with arms outstretched above head working on nuts/bolts/etc - unsure if injury then.  Constant, dull, non radiating, worse w certain movements of torso, neck, shoulder area. No pleuritic pain. No associated sob, nv or diaphoresis. No exertional chest pain or discomfort. No hx cad. Fam hx unspec heart issues in older age. No recent surgery/immobility. No leg pain or swelling. No hx dvt or pe. No cough or uri symptoms.has noted some indigestion/reflux symptoms but indicates that is not unusual for him. No fever or chills.   The history is provided by the patient and medical records.  Chest Pain Associated symptoms: no abdominal pain, no back pain, no cough, no fever, no headache, no nausea, no palpitations, no shortness of breath and no vomiting        Prior to Admission medications   Medication Sig Start Date End Date Taking? Authorizing Provider  ibuprofen (ADVIL,MOTRIN) 200 MG tablet Take 400 mg by mouth every 6 (six) hours as needed for mild pain.    [provider]    Allergies: Patient has no known allergies.    Review of Systems  Constitutional:  Negative for chills and fever.  HENT:  Negative for sore throat.   Respiratory:  Negative for cough and shortness of breath.   Cardiovascular:  Positive for chest pain. Negative for palpitations and leg swelling.  Gastrointestinal:  Negative for abdominal pain, nausea and vomiting.  Genitourinary:  Negative for flank pain.  Musculoskeletal:  Negative for back pain and neck pain.  Neurological:  Negative for headaches.    Updated Vital Signs BP (!) 143/100   Pulse 77   Temp 97.9 F (36.6 C)   Resp  14   Wt 106.6 kg   SpO2 98%   BMI 31.87 kg/m   Physical Exam Vitals and nursing note reviewed.  Constitutional:      Appearance: Normal appearance. He is well-developed.  HENT:     Head: Atraumatic.     Nose: Nose normal.     Mouth/Throat:     Mouth: Mucous membranes are moist.  Eyes:     General: No scleral icterus.    Conjunctiva/sclera: Conjunctivae normal.  Neck:     Trachea: No tracheal deviation.  Cardiovascular:     Rate and Rhythm: Normal rate and regular rhythm.     Pulses: Normal pulses.     Heart sounds: Normal heart sounds. No murmur heard.    No friction rub. No gallop.  Pulmonary:     Effort: Pulmonary effort is normal. No accessory muscle usage or respiratory distress.     Breath sounds: Normal breath sounds.     Comments: Mild anterior chest wall tenderness. No sts noted,  no skin changes, lesions or rash.  Abdominal:     General: There is no distension.     Palpations: Abdomen is soft.     Tenderness: There is no abdominal tenderness.  Musculoskeletal:        General: No swelling or tenderness.     Cervical back: Normal range of motion and neck supple. No rigidity.     Right lower leg: No edema.  Left lower leg: No edema.  Skin:    General: Skin is warm and dry.     Findings: No rash.  Neurological:     Mental Status: He is alert.     Comments: Alert, speech clear. Steady gait.   Psychiatric:        Mood and Affect: Mood normal.     (all labs ordered are listed, but only abnormal results are displayed) Results for orders placed or performed during the hospital encounter of 07/16/24  Basic metabolic panel   Collection Time: 07/16/24  7:22 PM  Result Value Ref Range   Sodium 139 135 - 145 mmol/L   Potassium 3.6 3.5 - 5.1 mmol/L   Chloride 104 98 - 111 mmol/L   CO2 20 (L) 22 - 32 mmol/L   Glucose, Bld 92 70 - 99 mg/dL   BUN 13 6 - 20 mg/dL   Creatinine, Ser 9.22 0.61 - 1.24 mg/dL   Calcium 9.6 8.9 - 89.6 mg/dL   GFR, Estimated >39 >39  mL/min   Anion gap 16 (H) 5 - 15  CBC   Collection Time: 07/16/24  7:22 PM  Result Value Ref Range   WBC 6.4 4.0 - 10.5 K/uL   RBC 5.13 4.22 - 5.81 MIL/uL   Hemoglobin 16.2 13.0 - 17.0 g/dL   HCT 56.7 60.9 - 47.9 %   MCV 84.2 80.0 - 100.0 fL   MCH 31.6 26.0 - 34.0 pg   MCHC 37.5 (H) 30.0 - 36.0 g/dL   RDW 88.0 88.4 - 84.4 %   Platelets 169 150 - 400 K/uL   nRBC 0.0 0.0 - 0.2 %  Troponin T, High Sensitivity   Collection Time: 07/16/24  7:22 PM  Result Value Ref Range   Troponin T High Sensitivity <15 0 - 19 ng/L  Troponin T, High Sensitivity   Collection Time: 07/16/24 10:10 PM  Result Value Ref Range   Troponin T High Sensitivity <15 0 - 19 ng/L   DG Chest 2 View Result Date: 07/16/2024 CLINICAL DATA:  CP EXAM: CHEST - 2 VIEW COMPARISON:  06/06/2018 FINDINGS: Low lung volumes. No focal airspace consolidation, pleural effusion, or pneumothorax. No cardiomegaly.No acute fracture or destructive lesion. IMPRESSION: No acute cardiopulmonary abnormality. Electronically Signed   By: Rogelia Myers M.D.   On: 07/16/2024 19:36     EKG: EKG Interpretation Date/Time:  Wednesday July 16 2024 19:19:07 EDT Ventricular Rate:  85 PR Interval:  140 QRS Duration:  122 QT Interval:  376 QTC Calculation: 448 R Axis:   -80  Text Interpretation: Sinus rhythm Nonspecific IVCD with LAD No significant change since last tracing Confirmed by Bernard Drivers ([id number]) on 07/16/2024 7:26:14 PM  Radiology: DG Chest 2 View Result Date: 07/16/2024 CLINICAL DATA:  CP EXAM: CHEST - 2 VIEW COMPARISON:  06/06/2018 FINDINGS: Low lung volumes. No focal airspace consolidation, pleural effusion, or pneumothorax. No cardiomegaly.No acute fracture or destructive lesion. IMPRESSION: No acute cardiopulmonary abnormality. Electronically Signed   By: Rogelia Myers M.D.   On: 07/16/2024 19:36     Procedures   Medications Ordered in the ED  famotidine (PEPCID) tablet 20 mg (20 mg Oral Patient Refused/Not Given  07/16/24 2214)  acetaminophen (TYLENOL) tablet 1,000 mg (1,000 mg Oral Given 07/16/24 2212)  alum & mag hydroxide-simeth (MAALOX/MYLANTA) 200-200-20 MG/5ML suspension 30 mL (30 mLs Oral Given 07/16/24 2212)  Medical Decision Making Problems Addressed: Chest wall pain: acute illness or injury Elevated blood pressure reading: acute illness or injury Precordial chest pain: acute illness or injury with systemic symptoms that poses a threat to life or bodily functions  Amount and/or Complexity of Data Reviewed External Data Reviewed: notes. Labs: ordered. Decision-making details documented in ED Course. Radiology: ordered and independent interpretation performed. Decision-making details documented in ED Course. ECG/medicine tests: ordered and independent interpretation performed. Decision-making details documented in ED Course.  Risk OTC drugs. Decision regarding hospitalization.   Iv ns. Continuous pulse ox and cardiac monitoring. Labs ordered/sent. Imaging ordered.   Differential diagnosis includes acs, msk cp, gi cp, etc. Dispo decision including potential need for admission considered - will get labs and imaging and reassess.   Reviewed nursing notes and prior charts for additional history. External reports reviewed.   Cardiac monitor: sinus rhythm, rate 85.  Labs reviewed/interpreted by me - chem largely unremarkable. Wbc and hgb normal. Trop normal - after symptoms for prolonged period, felt not c/w acs. Additional labs reviewed/interpreted by me - delta trop normal.   Xrays reviewed/interpreted by me - no pna.   Pt currently appears comfortable and stable for ed d/c.   Rec close pcp f/u.  Return precautions provided.       Final diagnoses:  Precordial chest pain  Chest wall pain  Elevated blood pressure reading    ED Discharge Orders     None          Bernard Drivers, MD 07/16/24 2301

## 2024-07-16 NOTE — Discharge Instructions (Addendum)
 It was our pleasure to provide your ER care today - we hope that you feel better.  If GI/reflux symptoms, try taking pepcid and maalox as need for symptom relief. You may also try taking acetaminophen or ibuprofen as need for symptom relief.   Follow up closely with primary care doctor in the coming week. Your blood pressure is high tonight - follow up closely with primary care doctor in the coming week.   Return to ER right away if worse, new symptoms, fevers, recurrent/persistent chest pain, increased trouble breathing, or other concern.

## 2024-07-16 NOTE — ED Triage Notes (Signed)
 Pt is here for evaluation of central chest pain x4 weeks that feels like indigestion and some pulled muscle and he can feel the discomfort in his chest when moving his head.  Pt states that this began after laying on some metal with arms down in a hole 4 weeks ago right before this began.  The symptoms have been intermittent and waxing and waning.
